# Patient Record
Sex: Male | Born: 2000 | Race: Black or African American | Hispanic: No | Marital: Single | Smoking: Never smoker
Health system: Southern US, Community
[De-identification: ages and names within clinical notes are randomized; demographics above are authoritative.]

---
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
## 2019-10-23 DIAGNOSIS — L720 Epidermal cyst: Secondary | ICD-10-CM | POA: Insufficient documentation

## 2021-02-06 ENCOUNTER — Other Ambulatory Visit: Payer: Self-pay

## 2021-02-06 ENCOUNTER — Emergency Department: Payer: Medicaid Other

## 2021-02-06 ENCOUNTER — Encounter: Payer: Self-pay | Admitting: *Deleted

## 2021-02-06 ENCOUNTER — Emergency Department
Admission: EM | Admit: 2021-02-06 | Discharge: 2021-02-06 | Disposition: A | Discharge status: Home / Self Care | Payer: Medicaid Other | Attending: Emergency Medicine | Admitting: Emergency Medicine

## 2021-02-06 DIAGNOSIS — M79604 Pain in right leg: Secondary | ICD-10-CM

## 2021-02-06 DIAGNOSIS — M79661 Pain in right lower leg: Secondary | ICD-10-CM | POA: Diagnosis present

## 2021-02-06 NOTE — ED Provider Notes (Signed)
[facility] Emergency Department Provider Note  ____________________________________________  Time seen: Approximately 8:35 PM  I have reviewed the triage vital signs and the nursing notes.   HISTORY  Chief Complaint Foot Pain    HPI Corey Cherry is a 20 y.o. male who presents the emergency department complaining of a painful area to his right calf.  Patient states that he has had a knot form with pain and swelling of his calf and foot.  Patient states that he is having no shortness of breath, chest pain.  No injuries to the area to either the skin or musculoskeletal structures.  Patient states that primarily most of the symptoms are along the medial aspect of the calf running into the foot itself.  No history of same.  No history of DVT or PE.  No medications for his complaint prior to arrival.       History reviewed. No pertinent past medical history.  There are no problems to display for this patient.   History reviewed. No pertinent surgical history.  Prior to Admission medications   Not on File    Allergies Patient has no known allergies.  No family history on file.  Social History Social History   Tobacco Use   Smoking status: Never   Smokeless tobacco: Never  Substance Use Topics   Alcohol use: No     Review of Systems  Constitutional: No fever/chills Eyes: No visual changes. No discharge ENT: No upper respiratory complaints. Cardiovascular: no chest pain. Respiratory: no cough. No SOB. Gastrointestinal: No abdominal pain.  No nausea, no vomiting.  No diarrhea.  No constipation. Musculoskeletal: Positive for pain, edema along the medial calf Skin: Negative for rash, abrasions, lacerations, ecchymosis. Neurological: Negative for headaches, focal weakness or numbness.  10 System ROS otherwise negative.  ____________________________________________   PHYSICAL EXAM:  VITAL SIGNS: ED Triage Vitals  Enc Vitals Group     BP  02/06/21 1909 135/81     Pulse Rate 02/06/21 1909 90     Resp 02/06/21 1909 18     Temp 02/06/21 1909 98.4 F (36.9 C)     Temp Source 02/06/21 1909 Oral     SpO2 02/06/21 1909 98 %     Weight 02/06/21 1907 280 lb (127 kg)     Height 02/06/21 1907 5\' 8"  (1.727 m)     Head Circumference --      Peak Flow --      Pain Score 02/06/21 1907 9     Pain Loc --      Pain Edu? --      Excl. in GC? --      Constitutional: Alert and oriented. Well appearing and in no acute distress. Eyes: Conjunctivae are normal. PERRL. EOMI. Head: Atraumatic. ENT:      Ears:       Nose: No congestion/rhinnorhea.      Mouth/Throat: Mucous membranes are moist.  Neck: No stridor.    Cardiovascular: Normal rate, regular rhythm. Normal S1 and S2.  Good peripheral circulation. Respiratory: Normal respiratory effort without tachypnea or retractions. Lungs CTAB. Good air entry to the bases with no decreased or absent breath sounds. Musculoskeletal: Full range of motion to all extremities. No gross deformities appreciated.  Visualization of the right leg reveals minimal edema when compared to left.  No gross erythema.  No open sores.  Patient has a palpable finding mid to distal medial calf that appears also most scribed, likely epidermal cyst.  Patient is tender  through the calf into the foot.  Peripheral pulse intact.  Sensation intact. Neurologic:  Normal speech and language. No gross focal neurologic deficits are appreciated.  Skin:  Skin is warm, dry and intact. No rash noted. Psychiatric: Mood and affect are normal. Speech and behavior are normal. Patient exhibits appropriate insight and judgement.   ____________________________________________   LABS (all labs ordered are listed, but only abnormal results are displayed)  Labs Reviewed - No data to display ____________________________________________  EKG   ____________________________________________  RADIOLOGY I personally viewed and evaluated  these images as part of my medical decision making, as well as reviewing the written report by the radiologist.  ED Provider Interpretation: No acute findings on x-ray.  Ultrasound feels no evidence of DVT  US Venous Img Lower Unilateral Right  Result Date: 02/06/2021 CLINICAL DATA:  Acute right calf swelling and pain. EXAM: Right LOWER EXTREMITY VENOUS DOPPLER ULTRASOUND TECHNIQUE: Gray-scale sonography with compression, as well as color and duplex ultrasound, were performed to evaluate the deep venous system(s) from the level of the common femoral vein through the popliteal and proximal calf veins. COMPARISON:  None. FINDINGS: VENOUS Normal compressibility of the common femoral, superficial femoral, and popliteal veins, as well as the visualized calf veins. Visualized portions of profunda femoral vein and great saphenous vein unremarkable. No filling defects to suggest DVT on grayscale or color Doppler imaging. Doppler waveforms show normal direction of venous flow, normal respiratory plasticity and response to augmentation. Limited views of the contralateral common femoral vein are unremarkable. OTHER None. Limitations: none IMPRESSION: Negative. Electronically Signed   By: Lupita Raider M.D.   On: 02/06/2021 21:16   DG Foot Complete Right  Result Date: 02/06/2021 CLINICAL DATA:  Right foot pain and swelling EXAM: RIGHT FOOT COMPLETE - 3+ VIEW COMPARISON:  None. FINDINGS: Frontal, oblique, and lateral views of the right foot are obtained. No fracture, subluxation, or dislocation. Joint spaces are well preserved. Mild diffuse soft tissue swelling most pronounced within the dorsum of the midfoot and forefoot. IMPRESSION: 1. Soft tissue swelling.  No acute bony abnormality. Electronically Signed   By: Sharlet Salina M.D.   On: 02/06/2021 19:43    ____________________________________________    PROCEDURES  Procedure(s) performed:    Procedures    Medications - No data to  display   ____________________________________________   INITIAL IMPRESSION / ASSESSMENT AND PLAN / ED COURSE  Pertinent labs & imaging results that were available during my care of the patient were reviewed by me and considered in my medical decision making (see chart for details).  Review of the [place] CSRS was performed in accordance of the [facility] prior to dispensing any controlled drugs.           Patient's diagnosis is consistent with right lower extremity pain.  Patient presents emergency department with a knot that formed to the right calf with pain in the calf and swelling of the foot.  Physical exam appears to be likely an epidermal cyst, however given the sudden onset of the symptoms, pain in the calf with edema of the foot patient had x-ray and ultrasound.  These returned without acute findings.  There is no evidence for infection on physical exam.  No evidence of DVT.  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 Patient is given ED precautions to return to the ED for any worsening or new symptoms.  ____________________________________________  FINAL CLINICAL IMPRESSION(S) / ED DIAGNOSES  Final diagnoses:  Pain of right lower extremity      NEW MEDICATIONS STARTED DURING THIS VISIT:  ED Discharge Orders     None           This chart was dictated using voice recognition software/Dragon. Despite best efforts to proofread, errors can occur which can change the meaning. Any change was purely unintentional.    Lanette Hampshire 02/06/21 2153    Phineas Semen, MD 02/06/21 2209

## 2021-02-06 NOTE — ED Triage Notes (Addendum)
Pt reports pain in his right foot since Friday. States swelling and denies injury. At the beginning of the week he had a nonpainful knot on the inner right calf area. No cp, no sob. Ambulatory with steady gait

## 2021-02-06 NOTE — ED Notes (Signed)
US bedside

## 2021-10-04 ENCOUNTER — Ambulatory Visit (INDEPENDENT_AMBULATORY_CARE_PROVIDER_SITE_OTHER): Payer: Medicaid Other

## 2021-10-04 ENCOUNTER — Ambulatory Visit
Admission: EM | Admit: 2021-10-04 | Discharge: 2021-10-04 | Disposition: A | Discharge status: Home / Self Care | Payer: Medicaid Other | Attending: Emergency Medicine | Admitting: Emergency Medicine

## 2021-10-04 DIAGNOSIS — M79672 Pain in left foot: Secondary | ICD-10-CM

## 2021-10-04 DIAGNOSIS — S9032XA Contusion of left foot, initial encounter: Secondary | ICD-10-CM

## 2021-10-04 MED ORDER — IBUPROFEN 600 MG PO TABS: 600.0000 mg | ORAL_TABLET | Freq: Four times a day (QID) | ORAL | 0 refills | Status: AC | PRN

## 2021-10-04 NOTE — ED Triage Notes (Signed)
Patient presents to Urgent Care with complaints of left foot injury today. He states he works for [employer] and a package fell on his left foot. Did not take any meds for pain.  ?

## 2021-10-04 NOTE — ED Provider Notes (Signed)
?[facility] ? ? ? ?CSN: [id number] ?Arrival date & time: 10/04/21  1634 ? ? ?  ? ?History   ?Chief Complaint ?Chief Complaint  ?Patient presents with  ? Foot Injury  ? ? ?HPI ?Corey Cherry is a 21 y.o. male.  Patient presents with pain in his left foot since he dropped a box on it at work early this morning.  He denies numbness, weakness, paresthesias, or other symptoms.  No treatments at home.  His medical history includes asthma and obesity. ? ?The history is provided by the patient and medical records.  ? ?History reviewed. No pertinent past medical history. ? ?Patient Active Problem List  ? Diagnosis Date Noted  ? Epidermal inclusion cyst 10/23/2019  ? Acne vulgaris 09/05/2016  ? Elevated blood pressure reading 09/05/2016  ? Mild intermittent asthma, uncomplicated 09/05/2016  ? Obesity without serious comorbidity with body mass index (BMI) greater than 99th percentile for age in pediatric patient 09/05/2016  ? Tension type headache 09/05/2016  ? ? ?History reviewed. No pertinent surgical history. ? ? ? ? ?Home Medications   ? ?Prior to Admission medications   ?Medication Sig Start Date End Date Taking? Authorizing Provider  ?ibuprofen (ADVIL) 600 MG tablet Take 1 tablet (600 mg total) by mouth every 6 (six) hours as needed. 10/04/21  Yes Mickie Bail, NP  ? ? ?Family History ?History reviewed. No pertinent family history. ? ?Social History ?Social History  ? ?Tobacco Use  ? Smoking status: Never  ? Smokeless tobacco: Never  ?Substance Use Topics  ? Alcohol use: No  ? ? ? ?Allergies   ?Patient has no known allergies. ? ? ?Review of Systems ?Review of Systems  ?Musculoskeletal:  Positive for arthralgias and joint swelling. Negative for gait problem.  ?Skin:  Positive for color change. Negative for wound.  ?Neurological:  Negative for weakness and numbness.  ?All other systems reviewed and are negative. ? ? ?Physical Exam ?Triage Vital Signs ?ED Triage Vitals  ?Enc Vitals Group  ?   BP   ?   Pulse   ?    Resp   ?   Temp   ?   Temp src   ?   SpO2   ?   Weight   ?   Height   ?   Head Circumference   ?   Peak Flow   ?   Pain Score   ?   Pain Loc   ?   Pain Edu?   ?   Excl. in GC?   ? ?No data found. ? ?Updated Vital Signs ?BP 114/75   Pulse 63   Temp 98.4 ?F (36.9 ?C)   Resp 18   SpO2 98%  ? ?Visual Acuity ?Right Eye Distance:   ?Left Eye Distance:   ?Bilateral Distance:   ? ?Right Eye Near:   ?Left Eye Near:    ?Bilateral Near:    ? ?Physical Exam ?Vitals and nursing note reviewed.  ?Constitutional:   ?   General: He is not in acute distress. ?   Appearance: Normal appearance. He is well-developed. He is not ill-appearing.  ?HENT:  ?   Mouth/Throat:  ?   Mouth: Mucous membranes are moist.  ?Eyes:  ?   Conjunctiva/sclera: Conjunctivae normal.  ?Cardiovascular:  ?   Rate and Rhythm: Normal rate and regular rhythm.  ?   Heart sounds: Normal heart sounds.  ?Pulmonary:  ?   Effort: Pulmonary effort is normal. No respiratory distress.  ?  Breath sounds: Normal breath sounds.  ?Musculoskeletal:     ?   General: Swelling and tenderness present. No deformity. Normal range of motion.  ?   Cervical back: Neck supple.  ?     Feet: ? ?Feet:  ?   Comments: Left foot: sensation intact, FROM, strength 5/5. ?Skin: ?   General: Skin is warm and dry.  ?   Capillary Refill: Capillary refill takes less than 2 seconds.  ?   Findings: Bruising present.  ?Neurological:  ?   General: No focal deficit present.  ?   Mental Status: He is alert and oriented to person, place, and time.  ?   Sensory: No sensory deficit.  ?   Motor: No weakness.  ?   Gait: Gait normal.  ?Psychiatric:     ?   Mood and Affect: Mood normal.     ?   Behavior: Behavior normal.  ? ? ? ?UC Treatments / Results  ?Labs ?(all labs ordered are listed, but only abnormal results are displayed) ?Labs Reviewed - No data to display ? ?EKG ? ? ?Radiology ?DG Foot Complete Left ? ?Result Date: 10/04/2021 ?CLINICAL DATA:  Left foot crush injury EXAM: LEFT FOOT - COMPLETE 3+ VIEW  COMPARISON:  None Available. FINDINGS: There is no evidence of fracture or dislocation. There is no evidence of arthropathy or other focal bone abnormality. Soft tissues are unremarkable. IMPRESSION: Negative. Electronically Signed   By: Helyn Numbers M.D.   On: 10/04/2021 17:05   ? ?Procedures ?Procedures (including critical care time) ? ?Medications Ordered in UC ?Medications - No data to display ? ?Initial Impression / Assessment and Plan / UC Course  ?I have reviewed the triage vital signs and the nursing notes. ? ?Pertinent labs & imaging results that were available during my care of the patient were reviewed by me and considered in my medical decision making (see chart for details). ? ?  ?Left foot pain and contusion.  X-ray negative.  Discussed ibuprofen, rest, elevation, ice packs.  Discussed Neosporin and bandage to third toe around the nail.  Instructed patient to follow-up with orthopedics if his symptoms are not improving.  He agrees to plan of care. ? ?Final Clinical Impressions(s) / UC Diagnoses  ? ?Final diagnoses:  ?Left foot pain  ?Contusion of left foot, initial encounter  ? ? ? ?Discharge Instructions   ? ?  ?Take the ibuprofen as prescribed.  Rest and elevate your foot.  Apply ice packs 2-3 times a day for up to 20 minutes each.    ? ?Follow up with an orthopedist if your symptoms are not improving.   ? ? ? ? ? ?ED Prescriptions   ? ? Medication Sig Dispense Auth. Provider  ? ibuprofen (ADVIL) 600 MG tablet Take 1 tablet (600 mg total) by mouth every 6 (six) hours as needed. 30 tablet Mickie Bail, NP  ? ?  ? ?I have reviewed the PDMP during this encounter. ?  ?Mickie Bail, NP ?10/04/21 1714 ? ?

## 2021-10-04 NOTE — Discharge Instructions (Addendum)
Take the ibuprofen as prescribed.  Rest and elevate your foot.  Apply ice packs 2-3 times a day for up to 20 minutes each.      Follow up with an orthopedist if your symptoms are not improving.    

## 2022-09-18 ENCOUNTER — Ambulatory Visit
Admission: EM | Admit: 2022-09-18 | Discharge: 2022-09-18 | Disposition: A | Discharge status: Home / Self Care | Payer: Medicaid Other | Attending: Urgent Care | Admitting: Urgent Care

## 2022-09-18 DIAGNOSIS — R21 Rash and other nonspecific skin eruption: Secondary | ICD-10-CM | POA: Diagnosis not present

## 2022-09-18 MED ORDER — FLUCONAZOLE 150 MG PO TABS: 300.0000 mg | ORAL_TABLET | ORAL | 0 refills | Status: AC

## 2022-09-18 MED ORDER — TRIAMCINOLONE ACETONIDE 0.5 % EX CREA: 1.0000 | TOPICAL_CREAM | Freq: Two times a day (BID) | CUTANEOUS | 0 refills | Status: AC

## 2022-09-18 NOTE — ED Provider Notes (Signed)
Corey Cherry    CSN: [id number] Arrival date & time: 09/18/22  8295      History   Chief Complaint Chief Complaint  Patient presents with   Rash    HPI Corey Cherry is a 22 y.o. male.    Rash   Patient presents to urgent care for itchy rash located on bilateral arms x 2 months.  Has been applying OTC cream with some relief.  Denies fever.  Denies changes to any product use, diet, medications.  No past medical history on file.  Patient Active Problem List   Diagnosis Date Noted   Epidermal inclusion cyst 10/23/2019   Acne vulgaris 09/05/2016   Elevated blood pressure reading 09/05/2016   Mild intermittent asthma, uncomplicated 09/05/2016   Obesity without serious comorbidity with body mass index (BMI) greater than 99th percentile for age in pediatric patient 09/05/2016   Tension type headache 09/05/2016    No past surgical history on file.     Home Medications    Prior to Admission medications   Medication Sig Start Date End Date Taking? Authorizing Provider  ibuprofen (ADVIL) 600 MG tablet Take 1 tablet (600 mg total) by mouth every 6 (six) hours as needed. 10/04/21   Mickie Bail, NP    Family History No family history on file.  Social History Social History   Tobacco Use   Smoking status: Never   Smokeless tobacco: Never  Substance Use Topics   Alcohol use: No     Allergies   Patient has no known allergies.   Review of Systems Review of Systems  Skin:  Positive for rash.     Physical Exam Triage Vital Signs ED Triage Vitals  Enc Vitals Group     BP      Pulse      Resp      Temp      Temp src      SpO2      Weight      Height      Head Circumference      Peak Flow      Pain Score      Pain Loc      Pain Edu?      Excl. in GC?    No data found.  Updated Vital Signs There were no vitals taken for this visit.  Visual Acuity Right Eye Distance:   Left Eye Distance:   Bilateral Distance:    Right Eye Near:    Left Eye Near:    Bilateral Near:     Physical Exam Musculoskeletal:       Arms:       Legs:      UC Treatments / Results  Labs (all labs ordered are listed, but only abnormal results are displayed) Labs Reviewed - No data to display  EKG   Radiology No results found.  Procedures Procedures (including critical care time)  Medications Ordered in UC Medications - No data to display  Initial Impression / Assessment and Plan / UC Course  I have reviewed the triage vital signs and the nursing notes.  Pertinent labs & imaging results that were available during my care of the patient were reviewed by me and considered in my medical decision making (see chart for details).   Corey Cherry is a 22 y.o. male presenting with pruritic rash. Patient is afebrile without recent antipyretics, satting well on room air. Overall is well appearing though non-toxic, well hydrated, without respiratory distress.  Hypopigmented patches and erythematous patches appear on bilateral arms and behind knees  (See objective).  Rash on bilateral arms predominantly appears hypopigmented and resembling tinea versicolor.  Rash on back of knees and right AC resembles eczema.  Treating with fluconazole.  Also giving triamcinolone to relieve itching.  Counseled patient on potential for adverse effects with medications prescribed/recommended today, ER and return-to-clinic precautions discussed, patient verbalized understanding and agreement with care plan.   Final Clinical Impressions(s) / UC Diagnoses   Final diagnoses:  None   Discharge Instructions   None    ED Prescriptions   None    PDMP not reviewed this encounter.   Charma Igo, [place] 09/18/22 [phone]

## 2022-09-18 NOTE — ED Triage Notes (Signed)
Patient presents to UC for rash located on bilateral arms x 2 months. Applying OTC cream with some relief.   Denies fever, no changes to any products, diet, or meds.

## 2022-09-18 NOTE — Discharge Instructions (Signed)
Apply triamcinolone cream twice a day to affected areas to relieve itching.  Take oral medication prescribed  -  2 tablets today, 2 tablets in 1 week.  If your symptoms do not resolve, or recur, return for treatment or see your primary care provider or dermatologist.

## 2024-04-17 IMAGING — DX DG FOOT COMPLETE 3+V*L*
3 series · 3 of 3 positions shown · non-contrast
Comparison: None Available.

CLINICAL DATA: Left foot crush injury

EXAM:
LEFT FOOT - COMPLETE 3+ VIEW

[foot ap]
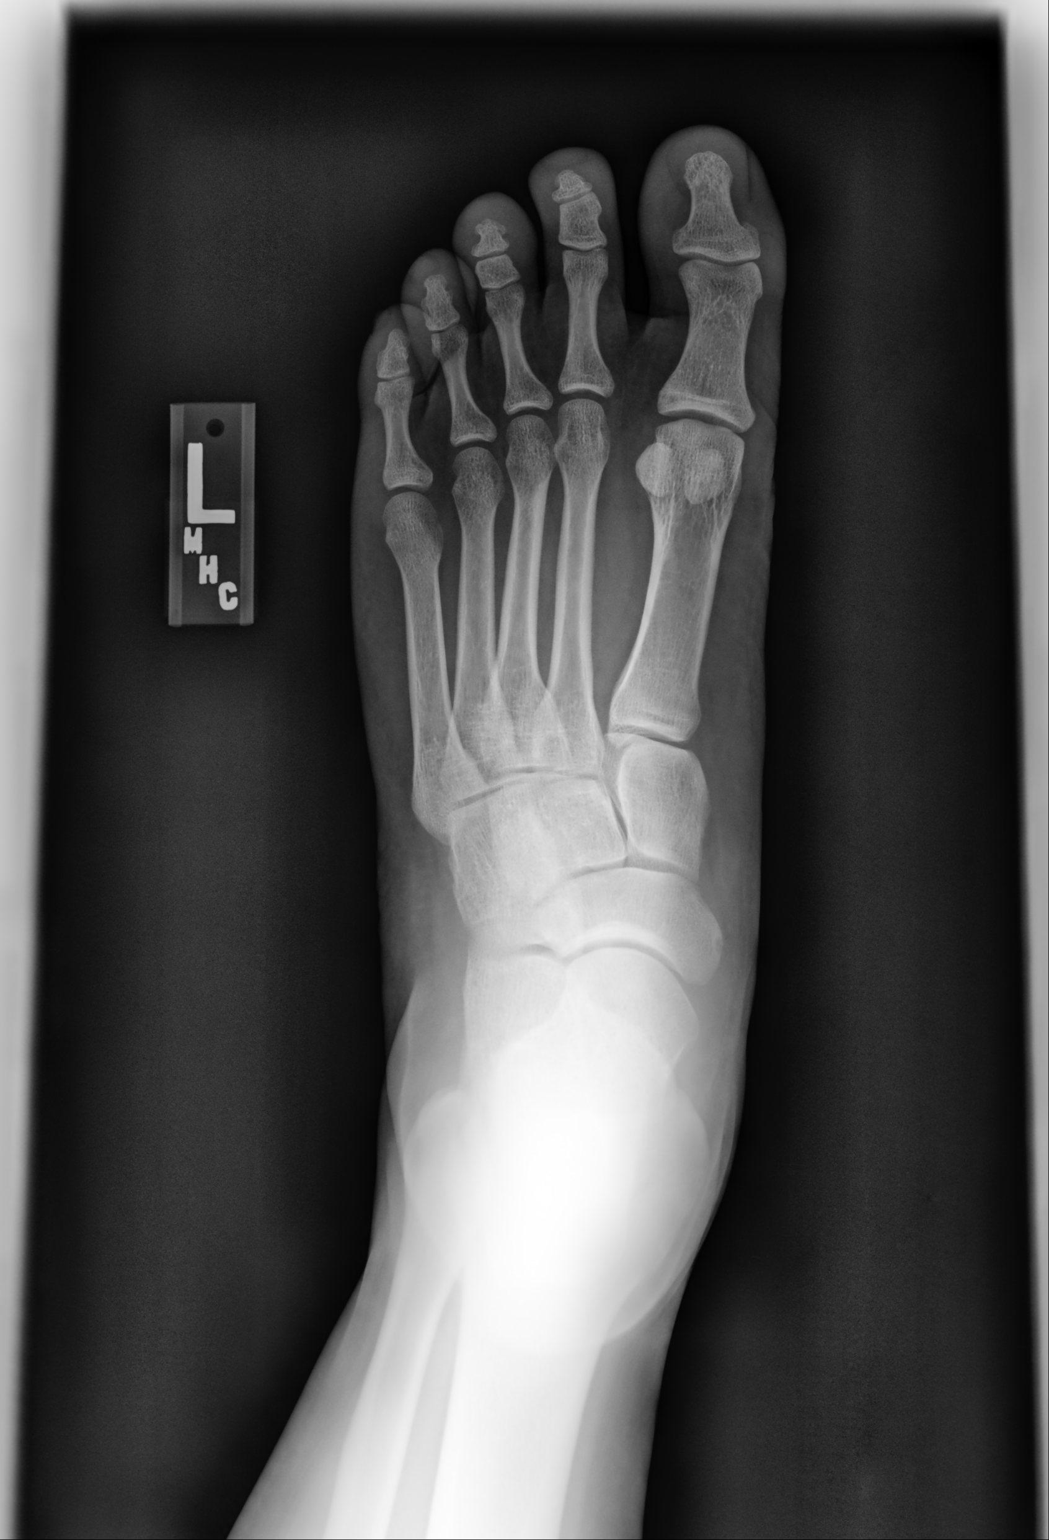

[foot mlo]
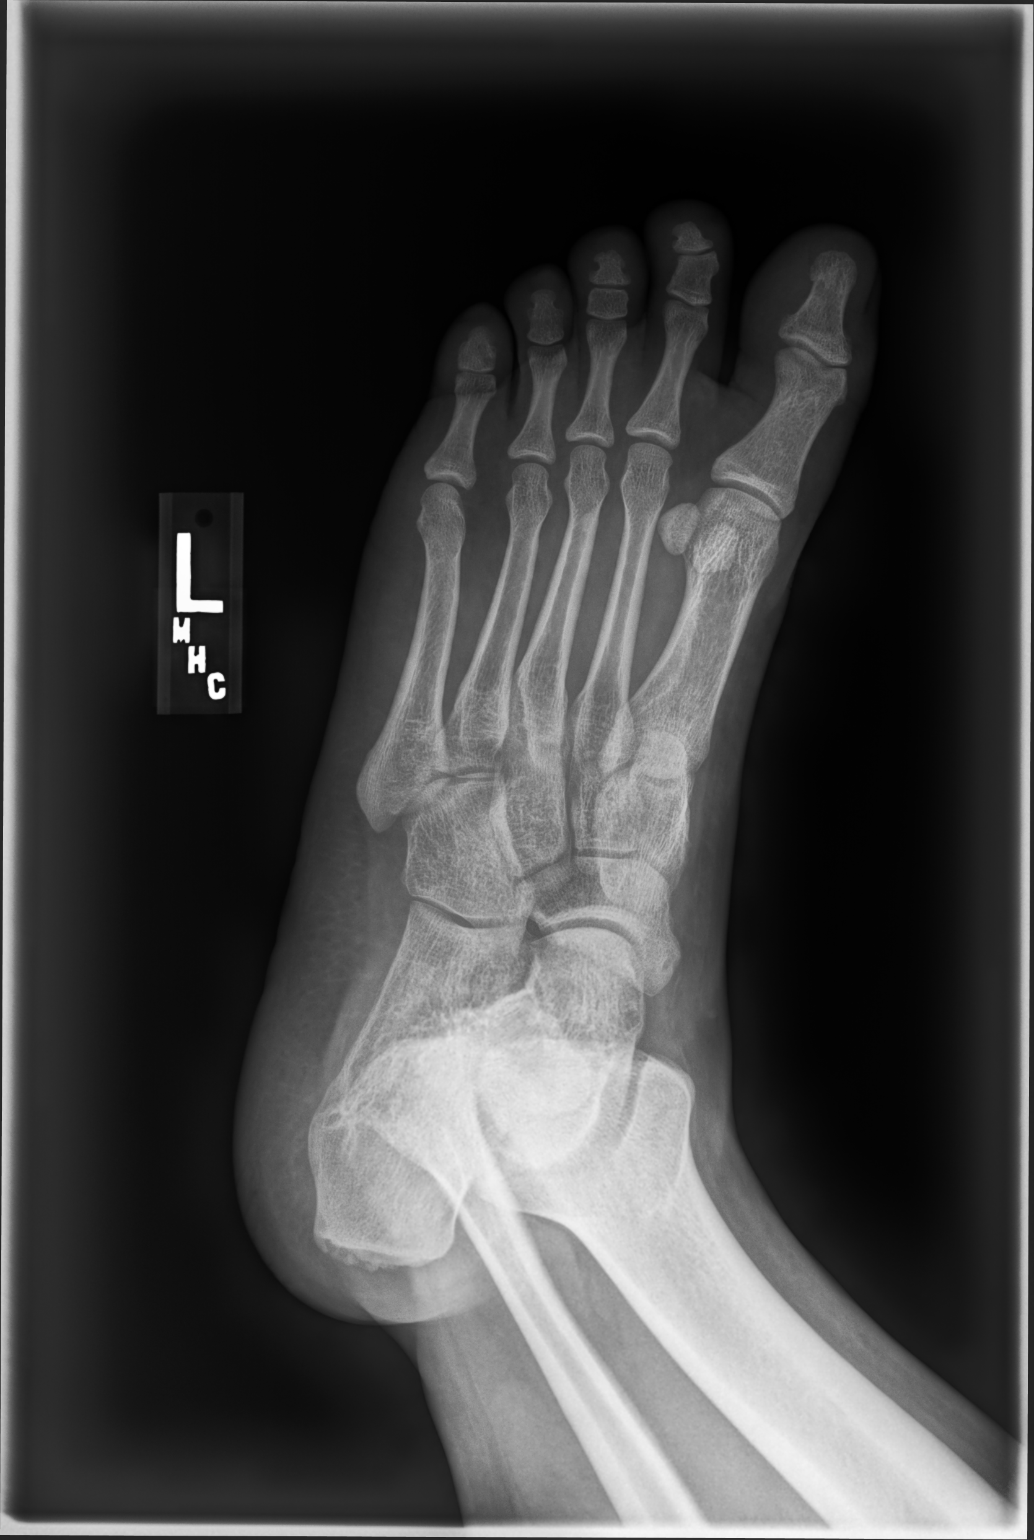

[foot lat]
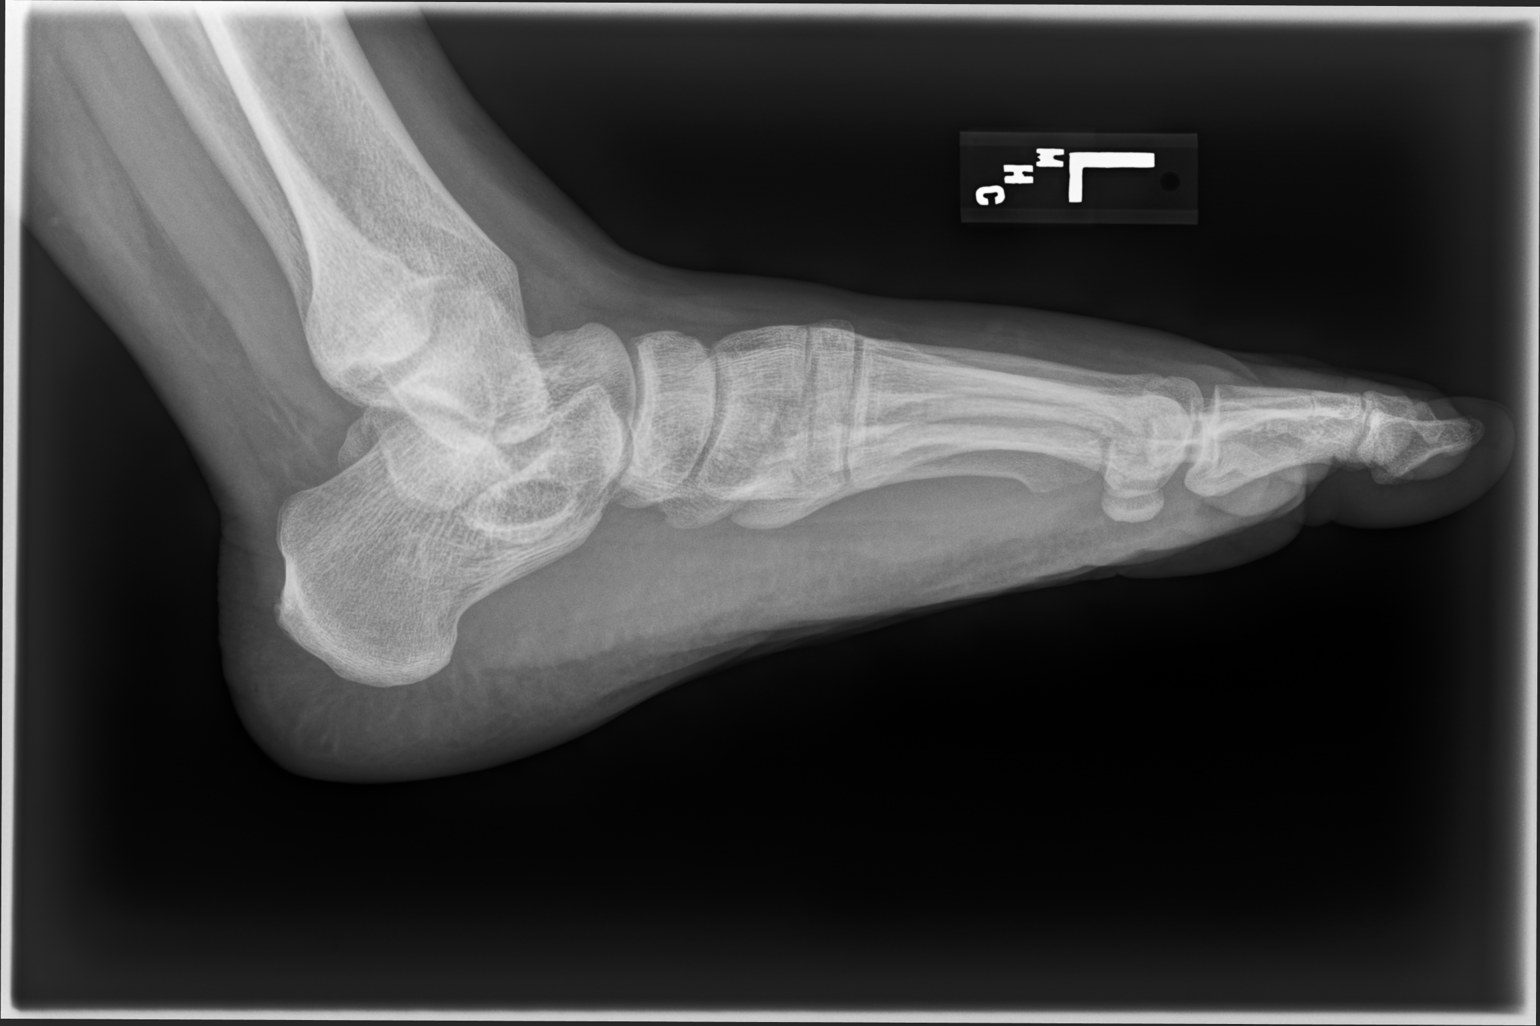

[3 of 3 positions shown; findings below may reference images not displayed]

FINDINGS: There is no evidence of fracture or dislocation. There is no
evidence of arthropathy or other focal bone abnormality. Soft
tissues are unremarkable.
IMPRESSION: Negative.
